# Patient Record
Sex: Female | Born: 1995 | Race: White | Hispanic: No | Marital: Single | Smoking: Current every day smoker
Health system: Southern US, Community
[De-identification: ages and names within clinical notes are randomized; demographics above are authoritative.]

## PROBLEM LIST (undated history)

## (undated) ENCOUNTER — Emergency Department (HOSPITAL_COMMUNITY): Discharge status: Absent without leave | Payer: Medicaid Other

## (undated) DIAGNOSIS — F329 Major depressive disorder, single episode, unspecified: Secondary | ICD-10-CM

## (undated) DIAGNOSIS — F32A Depression, unspecified: Secondary | ICD-10-CM

## (undated) DIAGNOSIS — J45909 Unspecified asthma, uncomplicated: Secondary | ICD-10-CM

## (undated) DIAGNOSIS — Z7289 Other problems related to lifestyle: Secondary | ICD-10-CM

## (undated) DIAGNOSIS — L905 Scar conditions and fibrosis of skin: Secondary | ICD-10-CM

## (undated) HISTORY — PX: LEG SURGERY: SHX1003

---
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
## 2017-05-16 ENCOUNTER — Emergency Department (HOSPITAL_COMMUNITY)
Admission: EM | Admit: 2017-05-16 | Discharge: 2017-05-16 | Disposition: A | Discharge status: Home / Self Care | Payer: Medicaid Other | Attending: Emergency Medicine | Admitting: Emergency Medicine

## 2017-05-16 DIAGNOSIS — J45909 Unspecified asthma, uncomplicated: Secondary | ICD-10-CM | POA: Insufficient documentation

## 2017-05-16 DIAGNOSIS — F17210 Nicotine dependence, cigarettes, uncomplicated: Secondary | ICD-10-CM | POA: Diagnosis not present

## 2017-05-16 DIAGNOSIS — Z79899 Other long term (current) drug therapy: Secondary | ICD-10-CM | POA: Insufficient documentation

## 2017-05-16 DIAGNOSIS — Z87898 Personal history of other specified conditions: Secondary | ICD-10-CM

## 2017-05-16 DIAGNOSIS — J00 Acute nasopharyngitis [common cold]: Secondary | ICD-10-CM | POA: Diagnosis not present

## 2017-05-16 DIAGNOSIS — R509 Fever, unspecified: Secondary | ICD-10-CM | POA: Diagnosis present

## 2017-05-16 LAB — RAPID STREP SCREEN (MED CTR MEBANE ONLY): Streptococcus, Group A Screen (Direct): NEGATIVE

## 2017-05-16 MED ORDER — ALBUTEROL SULFATE (2.5 MG/3ML) 0.083% IN NEBU
2.5000 mg | INHALATION_SOLUTION | Freq: Once | RESPIRATORY_TRACT | Status: AC
  Administered 2017-05-16: 2.5 mg via RESPIRATORY_TRACT
  Filled 2017-05-16: qty 3

## 2017-05-16 MED ORDER — ALBUTEROL SULFATE HFA 108 (90 BASE) MCG/ACT IN AERS: 1.0000 | INHALATION_SPRAY | Freq: Four times a day (QID) | RESPIRATORY_TRACT | 0 refills | Status: AC | PRN

## 2017-05-16 MED ORDER — PREDNISONE 10 MG PO TABS: ORAL_TABLET | ORAL | 0 refills | Status: AC

## 2017-05-16 MED ORDER — IPRATROPIUM-ALBUTEROL 0.5-2.5 (3) MG/3ML IN SOLN
3.0000 mL | Freq: Once | RESPIRATORY_TRACT | Status: AC
  Administered 2017-05-16: 3 mL via RESPIRATORY_TRACT
  Filled 2017-05-16: qty 3

## 2017-05-16 NOTE — ED Triage Notes (Signed)
Back from [place] and noticed sore throat yesterday and coughed up thick secretions.  Rates pain 7/10.

## 2017-05-16 NOTE — ED Notes (Signed)
RT called for breathing treatment.

## 2017-05-16 NOTE — Discharge Instructions (Signed)
I recommend a decongestant such as claritin D or zyrtec D for better nasal decongestant relief.  Use the prednisone as instructed for your wheezing and your albuterol every 4 hours if needed for wheeze or shortness of breath.  Other symptom relief measures include warm salt gargles, vicks vapor inhaler sticks and/or menthol lozenges which can help with congestion and sore throat.
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
## 2017-05-19 LAB — CULTURE, GROUP A STREP (THRC)

## 2017-10-21 ENCOUNTER — Encounter: Payer: Self-pay | Admitting: Emergency Medicine

## 2017-10-21 ENCOUNTER — Other Ambulatory Visit: Payer: Self-pay

## 2017-10-21 ENCOUNTER — Emergency Department
Admission: EM | Admit: 2017-10-21 | Discharge: 2017-10-21 | Disposition: A | Discharge status: Home / Self Care | Payer: Self-pay | Attending: Student in an Organized Health Care Education/Training Program | Admitting: Student in an Organized Health Care Education/Training Program

## 2017-10-21 DIAGNOSIS — Z79899 Other long term (current) drug therapy: Secondary | ICD-10-CM | POA: Insufficient documentation

## 2017-10-21 DIAGNOSIS — R509 Fever, unspecified: Secondary | ICD-10-CM | POA: Insufficient documentation

## 2017-10-21 DIAGNOSIS — J45909 Unspecified asthma, uncomplicated: Secondary | ICD-10-CM | POA: Insufficient documentation

## 2017-10-21 DIAGNOSIS — N12 Tubulo-interstitial nephritis, not specified as acute or chronic: Secondary | ICD-10-CM | POA: Insufficient documentation

## 2017-10-21 DIAGNOSIS — F17210 Nicotine dependence, cigarettes, uncomplicated: Secondary | ICD-10-CM | POA: Insufficient documentation

## 2017-10-21 LAB — COMPREHENSIVE METABOLIC PANEL
ALK PHOS: 63 U/L (ref 38–126)
ALT: 13 U/L — ABNORMAL LOW (ref 14–54)
ANION GAP: 10 (ref 5–15)
AST: 20 U/L (ref 15–41)
Albumin: 4 g/dL (ref 3.5–5.0)
BUN: 8 mg/dL (ref 6–20)
CALCIUM: 9.2 mg/dL (ref 8.9–10.3)
CO2: 19 mmol/L — AB (ref 22–32)
Chloride: 101 mmol/L (ref 101–111)
Creatinine, Ser: 0.51 mg/dL (ref 0.44–1.00)
Glucose, Bld: 128 mg/dL — ABNORMAL HIGH (ref 65–99)
Potassium: 3.5 mmol/L (ref 3.5–5.1)
SODIUM: 130 mmol/L — AB (ref 135–145)
Total Bilirubin: 0.9 mg/dL (ref 0.3–1.2)
Total Protein: 7.8 g/dL (ref 6.5–8.1)

## 2017-10-21 LAB — URINALYSIS, COMPLETE (UACMP) WITH MICROSCOPIC
Bilirubin Urine: NEGATIVE
Glucose, UA: NEGATIVE mg/dL
KETONES UR: NEGATIVE mg/dL
NITRITE: NEGATIVE
PH: 5 (ref 5.0–8.0)
Protein, ur: 100 mg/dL — AB
SPECIFIC GRAVITY, URINE: 1.028 (ref 1.005–1.030)

## 2017-10-21 LAB — POCT PREGNANCY, URINE: Preg Test, Ur: NEGATIVE

## 2017-10-21 LAB — CBC
HCT: 42.2 % (ref 35.0–47.0)
HEMOGLOBIN: 13.9 g/dL (ref 12.0–16.0)
MCH: 27.5 pg (ref 26.0–34.0)
MCHC: 33 g/dL (ref 32.0–36.0)
MCV: 83.5 fL (ref 80.0–100.0)
PLATELETS: 169 10*3/uL (ref 150–440)
RBC: 5.05 MIL/uL (ref 3.80–5.20)
RDW: 14.6 % — ABNORMAL HIGH (ref 11.5–14.5)
WBC: 12.2 10*3/uL — ABNORMAL HIGH (ref 3.6–11.0)

## 2017-10-21 MED ORDER — SODIUM CHLORIDE 0.9 % IV BOLUS (SEPSIS)
500.0000 mL | Freq: Once | INTRAVENOUS | Status: AC
  Administered 2017-10-21: 500 mL via INTRAVENOUS

## 2017-10-21 MED ORDER — KETOROLAC TROMETHAMINE 30 MG/ML IJ SOLN
15.0000 mg | Freq: Once | INTRAMUSCULAR | Status: AC
  Administered 2017-10-21: 15 mg via INTRAVENOUS
  Filled 2017-10-21: qty 1

## 2017-10-21 MED ORDER — MORPHINE SULFATE (PF) 4 MG/ML IV SOLN
INTRAVENOUS | Status: AC
  Filled 2017-10-21: qty 1

## 2017-10-21 MED ORDER — PROMETHAZINE HCL 12.5 MG PO TABS: 12.5000 mg | ORAL_TABLET | Freq: Four times a day (QID) | ORAL | 0 refills | Status: AC | PRN

## 2017-10-21 MED ORDER — CEPHALEXIN 500 MG PO CAPS
500.0000 mg | ORAL_CAPSULE | Freq: Once | ORAL | Status: AC
  Administered 2017-10-21: 500 mg via ORAL
  Filled 2017-10-21: qty 1

## 2017-10-21 MED ORDER — CEPHALEXIN 500 MG PO CAPS: 500.0000 mg | ORAL_CAPSULE | Freq: Three times a day (TID) | ORAL | 0 refills | Status: AC

## 2017-10-21 MED ORDER — ONDANSETRON HCL 4 MG/2ML IJ SOLN
4.0000 mg | Freq: Once | INTRAMUSCULAR | Status: AC | PRN
  Administered 2017-10-21: 4 mg via INTRAVENOUS
  Filled 2017-10-21: qty 2

## 2017-10-21 MED ORDER — MORPHINE SULFATE (PF) 4 MG/ML IV SOLN
4.0000 mg | Freq: Once | INTRAVENOUS | Status: AC
  Administered 2017-10-21: 4 mg via INTRAVENOUS

## 2017-10-21 MED ORDER — ONDANSETRON 4 MG PO TBDP: 4.0000 mg | ORAL_TABLET | Freq: Once | ORAL | Status: DC | PRN

## 2017-10-21 MED ORDER — PROMETHAZINE HCL 25 MG/ML IJ SOLN
12.5000 mg | Freq: Four times a day (QID) | INTRAMUSCULAR | Status: DC | PRN
  Administered 2017-10-21: 12.5 mg via INTRAVENOUS
  Filled 2017-10-21: qty 1
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
## 2017-10-21 NOTE — ED Notes (Signed)
Pt states she has body aches since Monday. Also reports nausea, vomiting, and diarrhea. Family at bedside.

## 2017-10-21 NOTE — ED Triage Notes (Addendum)
Patient to ER for c/o vomiting, joint pain, fever since day before yesterday.

## 2017-10-21 NOTE — ED Notes (Signed)
Patient ambulatory to lobby with NAD noted. Verbalized understanding of discharge instructions, prescriptions, and follow-up care.

## 2018-01-16 ENCOUNTER — Encounter: Payer: Self-pay | Admitting: Emergency Medicine

## 2018-01-16 ENCOUNTER — Emergency Department
Admission: EM | Admit: 2018-01-16 | Discharge: 2018-01-16 | Disposition: A | Discharge status: Home / Self Care | Payer: Self-pay | Attending: Emergency Medicine | Admitting: Emergency Medicine

## 2018-01-16 DIAGNOSIS — Z5321 Procedure and treatment not carried out due to patient leaving prior to being seen by health care provider: Secondary | ICD-10-CM | POA: Insufficient documentation

## 2018-01-16 DIAGNOSIS — R300 Dysuria: Secondary | ICD-10-CM | POA: Insufficient documentation

## 2018-01-16 LAB — URINALYSIS, COMPLETE (UACMP) WITH MICROSCOPIC
Bilirubin Urine: NEGATIVE
GLUCOSE, UA: NEGATIVE mg/dL
Ketones, ur: NEGATIVE mg/dL
Leukocytes, UA: NEGATIVE
NITRITE: POSITIVE — AB
PH: 5 (ref 5.0–8.0)
Protein, ur: 30 mg/dL — AB
SPECIFIC GRAVITY, URINE: 1.021 (ref 1.005–1.030)

## 2018-01-16 NOTE — ED Triage Notes (Signed)
Patient states that she has had foul smelling urine and pain with urination times one month.

## 2018-01-16 NOTE — ED Notes (Signed)
Pt left before seeing provider. Pt counseled that based on her urine test results, she might require antibiotics. Pt determined she could not wait as her boyfriend was being seen, also and was, apparently much sicker.

## 2018-01-17 ENCOUNTER — Other Ambulatory Visit: Payer: Self-pay

## 2018-01-17 ENCOUNTER — Emergency Department
Admission: EM | Admit: 2018-01-17 | Discharge: 2018-01-17 | Disposition: A | Discharge status: Home / Self Care | Payer: Medicaid Other | Attending: Emergency Medicine | Admitting: Emergency Medicine

## 2018-01-17 ENCOUNTER — Encounter: Payer: Self-pay | Admitting: Emergency Medicine

## 2018-01-17 DIAGNOSIS — N3000 Acute cystitis without hematuria: Secondary | ICD-10-CM | POA: Insufficient documentation

## 2018-01-17 DIAGNOSIS — J45909 Unspecified asthma, uncomplicated: Secondary | ICD-10-CM | POA: Insufficient documentation

## 2018-01-17 DIAGNOSIS — Z7689 Persons encountering health services in other specified circumstances: Secondary | ICD-10-CM

## 2018-01-17 DIAGNOSIS — Z202 Contact with and (suspected) exposure to infections with a predominantly sexual mode of transmission: Secondary | ICD-10-CM | POA: Insufficient documentation

## 2018-01-17 DIAGNOSIS — Z79899 Other long term (current) drug therapy: Secondary | ICD-10-CM | POA: Insufficient documentation

## 2018-01-17 DIAGNOSIS — F17210 Nicotine dependence, cigarettes, uncomplicated: Secondary | ICD-10-CM | POA: Insufficient documentation

## 2018-01-17 LAB — WET PREP, GENITAL
CLUE CELLS WET PREP: NONE SEEN
Sperm: NONE SEEN
Trich, Wet Prep: NONE SEEN
Yeast Wet Prep HPF POC: NONE SEEN

## 2018-01-17 LAB — POCT PREGNANCY, URINE: Preg Test, Ur: NEGATIVE

## 2018-01-17 MED ORDER — CEPHALEXIN 500 MG PO CAPS: 500.0000 mg | ORAL_CAPSULE | Freq: Two times a day (BID) | ORAL | 0 refills | Status: AC

## 2018-01-17 MED ORDER — AZITHROMYCIN 500 MG PO TABS
1000.0000 mg | ORAL_TABLET | Freq: Once | ORAL | Status: AC
  Administered 2018-01-17: 1000 mg via ORAL
  Filled 2018-01-17: qty 2

## 2018-01-17 MED ORDER — CEFTRIAXONE SODIUM 250 MG IJ SOLR
250.0000 mg | Freq: Once | INTRAMUSCULAR | Status: AC
  Administered 2018-01-17: 250 mg via INTRAMUSCULAR
  Filled 2018-01-17: qty 250

## 2018-01-17 NOTE — Discharge Instructions (Addendum)
You are being treated for a UTI. You have been treated for gonorrhea and chlamydia. You may call back to confirm results. See your provider for ongoing symptoms.

## 2018-01-17 NOTE — ED Triage Notes (Addendum)
Patient ambulatory to triage with steady gait, without difficulty or distress noted; pt reports her SO here last night and dx with STD; st she is here for treatment; c/o odorous urine and white vag discharge; UA completed last night
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
## 2018-01-18 LAB — CHLAMYDIA/NGC RT PCR (ARMC ONLY)
Chlamydia Tr: NOT DETECTED
N gonorrhoeae: NOT DETECTED

## 2018-08-21 ENCOUNTER — Emergency Department
Admission: EM | Admit: 2018-08-21 | Discharge: 2018-08-21 | Discharge status: Against Medical Advice | Payer: Medicaid Other
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
## 2019-01-02 ENCOUNTER — Encounter: Payer: Self-pay | Admitting: Emergency Medicine

## 2019-01-02 ENCOUNTER — Emergency Department
Admission: EM | Admit: 2019-01-02 | Discharge: 2019-01-02 | Disposition: A | Discharge status: Home / Self Care | Payer: Medicaid Other | Attending: Emergency Medicine | Admitting: Emergency Medicine

## 2019-01-02 ENCOUNTER — Other Ambulatory Visit: Payer: Self-pay

## 2019-01-02 DIAGNOSIS — Z9189 Other specified personal risk factors, not elsewhere classified: Secondary | ICD-10-CM

## 2019-01-02 DIAGNOSIS — R1031 Right lower quadrant pain: Secondary | ICD-10-CM | POA: Insufficient documentation

## 2019-01-02 DIAGNOSIS — Z5321 Procedure and treatment not carried out due to patient leaving prior to being seen by health care provider: Secondary | ICD-10-CM | POA: Insufficient documentation

## 2019-01-02 DIAGNOSIS — M545 Low back pain: Secondary | ICD-10-CM | POA: Insufficient documentation

## 2019-01-02 LAB — CBC
HCT: 41 % (ref 36.0–46.0)
Hemoglobin: 13.5 g/dL (ref 12.0–15.0)
MCH: 28.1 pg (ref 26.0–34.0)
MCHC: 32.9 g/dL (ref 30.0–36.0)
MCV: 85.2 fL (ref 80.0–100.0)
NRBC: 0 % (ref 0.0–0.2)
PLATELETS: 198 10*3/uL (ref 150–400)
RBC: 4.81 MIL/uL (ref 3.87–5.11)
RDW: 13.8 % (ref 11.5–15.5)
WBC: 15.3 10*3/uL — AB (ref 4.0–10.5)

## 2019-01-02 LAB — POCT PREGNANCY, URINE: PREG TEST UR: NEGATIVE

## 2019-01-02 LAB — COMPREHENSIVE METABOLIC PANEL
ALBUMIN: 3.9 g/dL (ref 3.5–5.0)
ALK PHOS: 82 U/L (ref 38–126)
ALT: 12 U/L (ref 0–44)
AST: 15 U/L (ref 15–41)
Anion gap: 8 (ref 5–15)
BILIRUBIN TOTAL: 0.4 mg/dL (ref 0.3–1.2)
BUN: 11 mg/dL (ref 6–20)
CALCIUM: 8.8 mg/dL — AB (ref 8.9–10.3)
CO2: 22 mmol/L (ref 22–32)
Chloride: 108 mmol/L (ref 98–111)
Creatinine, Ser: 0.49 mg/dL (ref 0.44–1.00)
GFR calc Af Amer: >60 mL/min (ref >60)
GLUCOSE: 112 mg/dL — AB (ref 70–99)
Potassium: 3.7 mmol/L (ref 3.5–5.1)
Sodium: 138 mmol/L (ref 135–145)
Total Protein: 6.7 g/dL (ref 6.5–8.1)

## 2019-01-02 LAB — URINALYSIS, COMPLETE (UACMP) WITH MICROSCOPIC
Bacteria, UA: NONE SEEN
SPECIFIC GRAVITY, URINE: 1.026 (ref 1.005–1.030)
WBC, UA: >50 WBC/hpf — ABNORMAL HIGH (ref 0–5)

## 2019-01-02 LAB — LIPASE, BLOOD: Lipase: 31 U/L (ref 11–51)

## 2019-01-02 NOTE — ED Triage Notes (Signed)
Here for pain across whole lower abdomen mostly on RLQ.  Also c/o pain across lower back. Hx of kidney infection per pt.  +hematuria.  No vomiting. No fever. VSS.  No dysuria.

## 2019-01-02 NOTE — ED Notes (Signed)
RN went to check on patient, patient not in room and found gown on stretcher. Patient not in hallway, MD notified that patient left. Per triage nurse, patient walked out front door.

## 2019-01-02 NOTE — ED Provider Notes (Signed)
-----------------------------------------   6:14 PM on 01/02/2019 -----------------------------------------  Pt outside her room in the hallway screaming in front of other patients and using profane language.  I explained her that we will be with her as soon as possible, she became very abusive and angry because she has been waiting for 2 hours and she has not been given any food.  I did explain to her that we are in a pandemic and we are doing her best on limited resources to take care of her and I would be in to see her as soon as I can.  I was with a acute patient.   she became very irate. Encouraged her to wait in her room and that we would see her asap. She threatened to make a film of the fact that she was not being seen and given food immediately, and put it on facebook.  I did explain to her that if she felt like she wanted to leave of course she could at any time she could but otherwise I would be to the room as soon as possible.  When return to the room she had gone.   Jeanmarie Plant, MD 01/02/19 [phone]

## 2019-01-04 LAB — URINE CULTURE

## 2019-03-28 ENCOUNTER — Emergency Department (HOSPITAL_COMMUNITY)
Admission: EM | Admit: 2019-03-28 | Discharge: 2019-03-28 | Disposition: A | Discharge status: Home / Self Care | Payer: Self-pay | Attending: Emergency Medicine | Admitting: Emergency Medicine

## 2019-03-28 ENCOUNTER — Encounter (HOSPITAL_COMMUNITY): Payer: Self-pay

## 2019-03-28 ENCOUNTER — Other Ambulatory Visit: Payer: Self-pay

## 2019-03-28 ENCOUNTER — Emergency Department (HOSPITAL_COMMUNITY): Payer: Self-pay

## 2019-03-28 DIAGNOSIS — F17210 Nicotine dependence, cigarettes, uncomplicated: Secondary | ICD-10-CM | POA: Insufficient documentation

## 2019-03-28 DIAGNOSIS — Z79899 Other long term (current) drug therapy: Secondary | ICD-10-CM | POA: Insufficient documentation

## 2019-03-28 DIAGNOSIS — Z880 Allergy status to penicillin: Secondary | ICD-10-CM | POA: Insufficient documentation

## 2019-03-28 DIAGNOSIS — R1031 Right lower quadrant pain: Secondary | ICD-10-CM

## 2019-03-28 DIAGNOSIS — K529 Noninfective gastroenteritis and colitis, unspecified: Secondary | ICD-10-CM | POA: Insufficient documentation

## 2019-03-28 DIAGNOSIS — J45909 Unspecified asthma, uncomplicated: Secondary | ICD-10-CM | POA: Insufficient documentation

## 2019-03-28 LAB — COMPREHENSIVE METABOLIC PANEL
ALT: 13 U/L (ref 0–44)
AST: 19 U/L (ref 15–41)
Albumin: 4.5 g/dL (ref 3.5–5.0)
Alkaline Phosphatase: 59 U/L (ref 38–126)
Anion gap: 8 (ref 5–15)
BUN: 9 mg/dL (ref 6–20)
CO2: 25 mmol/L (ref 22–32)
Calcium: 9.5 mg/dL (ref 8.9–10.3)
Chloride: 107 mmol/L (ref 98–111)
Creatinine, Ser: 0.76 mg/dL (ref 0.44–1.00)
GFR calc Af Amer: >60 mL/min (ref >60)
GFR calc non Af Amer: >60 mL/min (ref >60)
Glucose, Bld: 97 mg/dL (ref 70–99)
Potassium: 3.6 mmol/L (ref 3.5–5.1)
Sodium: 140 mmol/L (ref 135–145)
Total Bilirubin: 1.4 mg/dL — ABNORMAL HIGH (ref 0.3–1.2)
Total Protein: 7.1 g/dL (ref 6.5–8.1)

## 2019-03-28 LAB — CBC WITH DIFFERENTIAL/PLATELET
Abs Immature Granulocytes: 0.01 10*3/uL (ref 0.00–0.07)
Basophils Absolute: 0 10*3/uL (ref 0.0–0.1)
Basophils Relative: 0 %
Eosinophils Absolute: 0 10*3/uL (ref 0.0–0.5)
Eosinophils Relative: 1 %
HCT: 42.7 % (ref 36.0–46.0)
Hemoglobin: 13.6 g/dL (ref 12.0–15.0)
Immature Granulocytes: 0 %
Lymphocytes Relative: 26 %
Lymphs Abs: 1.7 10*3/uL (ref 0.7–4.0)
MCH: 28.4 pg (ref 26.0–34.0)
MCHC: 31.9 g/dL (ref 30.0–36.0)
MCV: 89.1 fL (ref 80.0–100.0)
Monocytes Absolute: 0.5 10*3/uL (ref 0.1–1.0)
Monocytes Relative: 8 %
Neutro Abs: 4.1 10*3/uL (ref 1.7–7.7)
Neutrophils Relative %: 65 %
Platelets: 233 10*3/uL (ref 150–400)
RBC: 4.79 MIL/uL (ref 3.87–5.11)
RDW: 13.8 % (ref 11.5–15.5)
WBC: 6.4 10*3/uL (ref 4.0–10.5)
nRBC: 0 % (ref 0.0–0.2)

## 2019-03-28 LAB — RAPID URINE DRUG SCREEN, HOSP PERFORMED
Amphetamines: POSITIVE — AB
Barbiturates: NOT DETECTED
Benzodiazepines: NOT DETECTED
Cocaine: NOT DETECTED
Opiates: NOT DETECTED
Tetrahydrocannabinol: NOT DETECTED

## 2019-03-28 LAB — I-STAT BETA HCG BLOOD, ED (MC, WL, AP ONLY): I-stat hCG, quantitative: <5 m[IU]/mL (ref <5)

## 2019-03-28 LAB — URINALYSIS, ROUTINE W REFLEX MICROSCOPIC
Bacteria, UA: NONE SEEN
Bilirubin Urine: NEGATIVE
Glucose, UA: NEGATIVE mg/dL
Hgb urine dipstick: NEGATIVE
Ketones, ur: 20 mg/dL — AB
Leukocytes,Ua: NEGATIVE
Nitrite: NEGATIVE
Protein, ur: 30 mg/dL — AB
Specific Gravity, Urine: 1.024 (ref 1.005–1.030)
pH: 6 (ref 5.0–8.0)

## 2019-03-28 LAB — LACTIC ACID, PLASMA: Lactic Acid, Venous: 0.7 mmol/L (ref 0.5–1.9)

## 2019-03-28 LAB — LIPASE, BLOOD: Lipase: 19 U/L (ref 11–51)

## 2019-03-28 MED ORDER — DICYCLOMINE HCL 20 MG PO TABS: 20.0000 mg | ORAL_TABLET | Freq: Two times a day (BID) | ORAL | 0 refills | Status: AC | PRN

## 2019-03-28 MED ORDER — KETOROLAC TROMETHAMINE 15 MG/ML IJ SOLN
15.0000 mg | Freq: Once | INTRAMUSCULAR | Status: AC
  Administered 2019-03-28: 15 mg via INTRAVENOUS
  Filled 2019-03-28: qty 1

## 2019-03-28 MED ORDER — ONDANSETRON HCL 4 MG/2ML IJ SOLN
4.0000 mg | Freq: Once | INTRAMUSCULAR | Status: AC
  Administered 2019-03-28: 4 mg via INTRAVENOUS
  Filled 2019-03-28: qty 2

## 2019-03-28 MED ORDER — IOPAMIDOL (ISOVUE-300) INJECTION 61%
100.0000 mL | Freq: Once | INTRAVENOUS | Status: AC | PRN
  Administered 2019-03-28: 100 mL via INTRAVENOUS

## 2019-03-28 MED ORDER — DICYCLOMINE HCL 10 MG PO CAPS
10.0000 mg | ORAL_CAPSULE | Freq: Once | ORAL | Status: AC
  Administered 2019-03-28: 10 mg via ORAL
  Filled 2019-03-28: qty 1

## 2019-03-28 MED ORDER — FENTANYL CITRATE (PF) 100 MCG/2ML IJ SOLN
25.0000 ug | Freq: Once | INTRAMUSCULAR | Status: AC
  Administered 2019-03-28: 08:00:00 25 ug via INTRAVENOUS
  Filled 2019-03-28: qty 2

## 2019-03-28 MED ORDER — ONDANSETRON HCL 4 MG PO TABS: 4.0000 mg | ORAL_TABLET | Freq: Three times a day (TID) | ORAL | 0 refills | Status: DC | PRN

## 2019-03-28 MED ORDER — SODIUM CHLORIDE 0.9 % IV BOLUS
1000.0000 mL | Freq: Once | INTRAVENOUS | Status: AC
  Administered 2019-03-28: 1000 mL via INTRAVENOUS
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
## 2019-03-28 NOTE — ED Notes (Signed)
Patient verbalizes understanding of discharge instructions. Opportunity for questioning and answers were provided. Armband removed by staff, pt discharged from ED.  

## 2019-03-28 NOTE — ED Notes (Signed)
HusbandNatale Milch - [phone]

## 2019-03-28 NOTE — ED Triage Notes (Addendum)
Pt reports RLQ pain that radiates to the side with nausea. Pt unable to eat. No vomiting. Pt also reports swelling and warmth in the area. Pt also reports constipation, last BM over the weekend, taking laxatives without relief.

## 2019-03-28 NOTE — Discharge Instructions (Addendum)
Take bentyl as needed for pain or spasm. Use zofran as needed for nausea and vomiting.  Use tylenol as needed for further pain control.  Use heating pads for further management/evaluation if pain persists.  Return to the ER if you develop fevers, persistent vomiting, severe/worsening pain, or any new, worsening, or concerning symptoms.

## 2019-03-28 NOTE — ED Notes (Signed)
Manual BP 122/78

## 2019-10-14 ENCOUNTER — Emergency Department: Payer: Self-pay

## 2019-10-14 ENCOUNTER — Other Ambulatory Visit: Payer: Self-pay

## 2019-10-14 ENCOUNTER — Emergency Department
Admission: EM | Admit: 2019-10-14 | Discharge: 2019-10-14 | Disposition: A | Discharge status: Home / Self Care | Payer: Self-pay | Attending: Emergency Medicine | Admitting: Emergency Medicine

## 2019-10-14 DIAGNOSIS — Y999 Unspecified external cause status: Secondary | ICD-10-CM | POA: Insufficient documentation

## 2019-10-14 DIAGNOSIS — S61224A Laceration with foreign body of right ring finger without damage to nail, initial encounter: Secondary | ICD-10-CM

## 2019-10-14 DIAGNOSIS — F17210 Nicotine dependence, cigarettes, uncomplicated: Secondary | ICD-10-CM | POA: Insufficient documentation

## 2019-10-14 DIAGNOSIS — W25XXXA Contact with sharp glass, initial encounter: Secondary | ICD-10-CM | POA: Insufficient documentation

## 2019-10-14 DIAGNOSIS — Z23 Encounter for immunization: Secondary | ICD-10-CM | POA: Insufficient documentation

## 2019-10-14 DIAGNOSIS — Y9389 Activity, other specified: Secondary | ICD-10-CM | POA: Insufficient documentation

## 2019-10-14 DIAGNOSIS — S61229A Laceration with foreign body of unspecified finger without damage to nail, initial encounter: Secondary | ICD-10-CM | POA: Insufficient documentation

## 2019-10-14 DIAGNOSIS — Y929 Unspecified place or not applicable: Secondary | ICD-10-CM | POA: Insufficient documentation

## 2019-10-14 DIAGNOSIS — J45909 Unspecified asthma, uncomplicated: Secondary | ICD-10-CM | POA: Insufficient documentation

## 2019-10-14 MED ORDER — LIDOCAINE HCL (PF) 1 % IJ SOLN
5.0000 mL | Freq: Once | INTRAMUSCULAR | Status: AC
  Administered 2019-10-14: 04:00:00 5 mL

## 2019-10-14 MED ORDER — OXYCODONE-ACETAMINOPHEN 5-325 MG PO TABS
2.0000 | ORAL_TABLET | Freq: Once | ORAL | Status: AC
  Administered 2019-10-14: 04:00:00 2 via ORAL
  Filled 2019-10-14: qty 2

## 2019-10-14 MED ORDER — TETANUS-DIPHTH-ACELL PERTUSSIS 5-2.5-18.5 LF-MCG/0.5 IM SUSP
INTRAMUSCULAR | Status: AC
  Filled 2019-10-14: qty 0.5

## 2019-10-14 MED ORDER — TETANUS-DIPHTH-ACELL PERTUSSIS 5-2.5-18.5 LF-MCG/0.5 IM SUSP
0.5000 mL | Freq: Once | INTRAMUSCULAR | Status: AC
  Administered 2019-10-14: 04:00:00 0.5 mL via INTRAMUSCULAR

## 2019-10-14 MED ORDER — CEPHALEXIN 500 MG PO CAPS: 500.0000 mg | ORAL_CAPSULE | Freq: Four times a day (QID) | ORAL | 0 refills | Status: AC

## 2019-10-14 MED ORDER — LIDOCAINE HCL (PF) 1 % IJ SOLN
INTRAMUSCULAR | Status: AC
  Filled 2019-10-14: qty 5

## 2019-10-14 MED ORDER — HYDROCODONE-ACETAMINOPHEN 5-325 MG PO TABS: 2.0000 | ORAL_TABLET | Freq: Four times a day (QID) | ORAL | 0 refills | Status: DC | PRN

## 2019-10-14 MED ORDER — CEPHALEXIN 500 MG PO CAPS
500.0000 mg | ORAL_CAPSULE | Freq: Once | ORAL | Status: AC
  Administered 2019-10-14: 05:00:00 500 mg via ORAL
  Filled 2019-10-14: qty 1

## 2019-10-14 NOTE — ED Notes (Signed)
Sterile dressing applied prior to application of finger splint.

## 2019-10-14 NOTE — Discharge Instructions (Addendum)
As we discussed, I was able to remove 2 pieces of glass from your finger laceration, but it appears that you still have at least 2 more pieces that are deep within your finger and close to the bone.  It would be dangerous for me to try to cut into the finger or drive the instruments deep into the finger here in the emergency department; it may cause permanent loss of sensation or damage to the blood supply.  Although preferably we could safely remove the pieces of glass, it is better to treat you with antibiotics and protect the finger and have you follow-up with a hand specialist for their recommendations about whether or not you need additional treatment.  Sometimes foreign bodies are left in place if it is thought that it will cause more damage to remove them than to leave them in.  Please take the prescribed antibiotics.  Use the finger splint provided for comfort.  I encourage you to wash your finger with soap and warm water and to keep it clean and dry.  Use over-the-counter Tylenol and ibuprofen according to label instructions for pain relief.  Call the clinic number provided to schedule the next available follow up appointment.
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
## 2019-10-14 NOTE — ED Triage Notes (Signed)
Patient reports she dropped a glass, attempted to catch it in right and, and glass broke in her hand. Laceration noted to distal 4th digit. Patient reports decreased sensation to entire hand. Patient able to flex/extend hand.
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
## 2019-10-14 NOTE — ED Notes (Signed)
MD in to repair laceration.

## 2020-02-20 ENCOUNTER — Emergency Department
Admission: EM | Admit: 2020-02-20 | Discharge: 2020-02-20 | Discharge status: Absent without leave | Payer: Medicaid Other

## 2020-07-29 ENCOUNTER — Emergency Department
Admission: EM | Admit: 2020-07-29 | Discharge: 2020-07-29 | Disposition: A | Discharge status: Home / Self Care | Payer: Medicaid Other | Attending: Emergency Medicine | Admitting: Emergency Medicine

## 2020-07-29 ENCOUNTER — Encounter: Payer: Self-pay | Admitting: *Deleted

## 2020-07-29 ENCOUNTER — Other Ambulatory Visit: Payer: Self-pay

## 2020-07-29 DIAGNOSIS — O26899 Other specified pregnancy related conditions, unspecified trimester: Secondary | ICD-10-CM | POA: Insufficient documentation

## 2020-07-29 DIAGNOSIS — Z3A00 Weeks of gestation of pregnancy not specified: Secondary | ICD-10-CM | POA: Insufficient documentation

## 2020-07-29 DIAGNOSIS — R102 Pelvic and perineal pain: Secondary | ICD-10-CM | POA: Insufficient documentation

## 2020-07-29 DIAGNOSIS — Z5321 Procedure and treatment not carried out due to patient leaving prior to being seen by health care provider: Secondary | ICD-10-CM | POA: Insufficient documentation

## 2020-07-29 DIAGNOSIS — O200 Threatened abortion: Secondary | ICD-10-CM | POA: Insufficient documentation

## 2020-07-29 LAB — COMPREHENSIVE METABOLIC PANEL
ALT: 61 U/L — ABNORMAL HIGH (ref 0–44)
AST: 32 U/L (ref 15–41)
Albumin: 4.4 g/dL (ref 3.5–5.0)
Alkaline Phosphatase: 67 U/L (ref 38–126)
Anion gap: 12 (ref 5–15)
BUN: 9 mg/dL (ref 6–20)
CO2: 24 mmol/L (ref 22–32)
Calcium: 9.5 mg/dL (ref 8.9–10.3)
Chloride: 102 mmol/L (ref 98–111)
Creatinine, Ser: 0.56 mg/dL (ref 0.44–1.00)
GFR, Estimated: >60 mL/min (ref >60)
Glucose, Bld: 92 mg/dL (ref 70–99)
Potassium: 3.7 mmol/L (ref 3.5–5.1)
Sodium: 138 mmol/L (ref 135–145)
Total Bilirubin: 1.2 mg/dL (ref 0.3–1.2)
Total Protein: 8.1 g/dL (ref 6.5–8.1)

## 2020-07-29 LAB — CBC WITH DIFFERENTIAL/PLATELET
Abs Immature Granulocytes: 0.02 10*3/uL (ref 0.00–0.07)
Basophils Absolute: 0 10*3/uL (ref 0.0–0.1)
Basophils Relative: 0 %
Eosinophils Absolute: 0.1 10*3/uL (ref 0.0–0.5)
Eosinophils Relative: 1 %
HCT: 40.1 % (ref 36.0–46.0)
Hemoglobin: 13.1 g/dL (ref 12.0–15.0)
Immature Granulocytes: 0 %
Lymphocytes Relative: 27 %
Lymphs Abs: 1.6 10*3/uL (ref 0.7–4.0)
MCH: 27.5 pg (ref 26.0–34.0)
MCHC: 32.7 g/dL (ref 30.0–36.0)
MCV: 84.2 fL (ref 80.0–100.0)
Monocytes Absolute: 0.4 10*3/uL (ref 0.1–1.0)
Monocytes Relative: 6 %
Neutro Abs: 3.9 10*3/uL (ref 1.7–7.7)
Neutrophils Relative %: 66 %
Platelets: 245 10*3/uL (ref 150–400)
RBC: 4.76 MIL/uL (ref 3.87–5.11)
RDW: 14.3 % (ref 11.5–15.5)
WBC: 6 10*3/uL (ref 4.0–10.5)
nRBC: 0 % (ref 0.0–0.2)

## 2020-07-29 LAB — HCG, QUANTITATIVE, PREGNANCY: hCG, Beta Chain, Quant, S: <1 m[IU]/mL (ref <5)

## 2020-07-29 MED ORDER — ONDANSETRON 4 MG PO TBDP
4.0000 mg | ORAL_TABLET | Freq: Once | ORAL | Status: DC
  Filled 2020-07-29: qty 1

## 2020-07-29 NOTE — ED Triage Notes (Signed)
Pt reporting she has been a month late on her period and had been thinking she may be pregnant. Pt denies having taken a pregnancy test though. Today pt started having pelvic cramping and vaginal bleeding. While using the bathroom pt reports feeling as though something "fell out."

## 2020-07-29 NOTE — ED Notes (Signed)
Pt unable to urinate at this time but given a urine cup and verbalized understanding to collect a sample when able.   During triage pt reports feeling nauseous and is requesting nausea medications.

## 2020-08-15 LAB — ABO/RH: ABO/RH(D): O POS

## 2021-02-07 ENCOUNTER — Other Ambulatory Visit: Payer: Self-pay

## 2021-02-07 ENCOUNTER — Emergency Department
Admission: EM | Admit: 2021-02-07 | Discharge: 2021-02-07 | Disposition: A | Discharge status: Home / Self Care | Payer: Medicaid Other | Attending: Emergency Medicine | Admitting: Emergency Medicine

## 2021-02-07 DIAGNOSIS — Z202 Contact with and (suspected) exposure to infections with a predominantly sexual mode of transmission: Secondary | ICD-10-CM | POA: Insufficient documentation

## 2021-02-07 DIAGNOSIS — F17210 Nicotine dependence, cigarettes, uncomplicated: Secondary | ICD-10-CM | POA: Insufficient documentation

## 2021-02-07 DIAGNOSIS — N764 Abscess of vulva: Secondary | ICD-10-CM | POA: Insufficient documentation

## 2021-02-07 DIAGNOSIS — L0291 Cutaneous abscess, unspecified: Secondary | ICD-10-CM

## 2021-02-07 DIAGNOSIS — J45909 Unspecified asthma, uncomplicated: Secondary | ICD-10-CM | POA: Insufficient documentation

## 2021-02-07 MED ORDER — FENTANYL CITRATE (PF) 100 MCG/2ML IJ SOLN
50.0000 ug | Freq: Once | INTRAMUSCULAR | Status: AC
  Administered 2021-02-07: 50 ug via INTRAVENOUS
  Filled 2021-02-07: qty 2

## 2021-02-07 MED ORDER — SULFAMETHOXAZOLE-TRIMETHOPRIM 800-160 MG PO TABS: 1.0000 | ORAL_TABLET | Freq: Two times a day (BID) | ORAL | 0 refills | Status: DC

## 2021-02-07 MED ORDER — DOXYCYCLINE HYCLATE 100 MG PO TABS
100.0000 mg | ORAL_TABLET | Freq: Once | ORAL | Status: AC
  Administered 2021-02-07: 100 mg via ORAL
  Filled 2021-02-07: qty 1

## 2021-02-07 MED ORDER — SODIUM CHLORIDE 0.9 % IV SOLN
1.0000 g | Freq: Once | INTRAVENOUS | Status: AC
  Administered 2021-02-07: 1 g via INTRAVENOUS
  Filled 2021-02-07: qty 10

## 2021-02-07 MED ORDER — OXYCODONE-ACETAMINOPHEN 5-325 MG PO TABS
1.0000 | ORAL_TABLET | Freq: Once | ORAL | Status: AC
  Administered 2021-02-07: 1 via ORAL
  Filled 2021-02-07: qty 1

## 2021-02-07 MED ORDER — ONDANSETRON 4 MG PO TBDP: 4.0000 mg | ORAL_TABLET | Freq: Three times a day (TID) | ORAL | 0 refills | Status: AC | PRN

## 2021-02-07 MED ORDER — KETAMINE HCL 10 MG/ML IJ SOLN
1.0000 mg/kg | Freq: Once | INTRAMUSCULAR | Status: AC
  Administered 2021-02-07: 64 mg via INTRAVENOUS
  Filled 2021-02-07: qty 1

## 2021-02-07 MED ORDER — ONDANSETRON HCL 4 MG/2ML IJ SOLN: 4.0000 mg | Freq: Once | INTRAMUSCULAR | Status: AC

## 2021-02-07 MED ORDER — LIDOCAINE HCL (PF) 1 % IJ SOLN
5.0000 mL | Freq: Once | INTRAMUSCULAR | Status: AC
  Administered 2021-02-07: 5 mL
  Filled 2021-02-07: qty 5

## 2021-02-07 MED ORDER — ONDANSETRON HCL 4 MG/2ML IJ SOLN
INTRAMUSCULAR | Status: AC
  Administered 2021-02-07: 4 mg via INTRAVENOUS
  Filled 2021-02-07: qty 2

## 2021-02-07 MED ORDER — DOXYCYCLINE HYCLATE 100 MG PO CAPS: 100.0000 mg | ORAL_CAPSULE | Freq: Two times a day (BID) | ORAL | 0 refills | Status: AC

## 2021-02-07 MED ORDER — KETOROLAC TROMETHAMINE 30 MG/ML IJ SOLN
15.0000 mg | Freq: Once | INTRAMUSCULAR | Status: AC
  Administered 2021-02-07: 15 mg via INTRAVENOUS
  Filled 2021-02-07: qty 1

## 2021-02-07 MED ORDER — OXYCODONE-ACETAMINOPHEN 5-325 MG PO TABS: 1.0000 | ORAL_TABLET | ORAL | 0 refills | Status: AC | PRN

## 2021-02-07 NOTE — Discharge Instructions (Signed)
You have been seen in the Emergency Department (ED) today for an abscess. A skin abscess is a bacterial infection that forms a pocket of pus. A boil is a kind of skin abscess. This was drained in the ED. If you were prescribed antibiotics, please make sure to take it fully as prescribed.  Follow-up with your doctor in 24-48 hours for a re-check. If you do not have a doctor you may return to the ER for a re-check.  How can you care for yourself at home?  Apply warm and dry compresses, a heating pad set on low, or a hot water bottle 3 or 4 times a day for pain. Keep a cloth between the heat source and your skin.  If your doctor prescribed antibiotics, take them as directed. Do not stop taking them just because you feel better. You need to take the full course of antibiotics.  Take pain medicines exactly as directed.  If the doctor gave you a prescription medicine for pain, take it as prescribed.  If you are not taking a prescription pain medicine, ask your doctor if you can take an over-the-counter medicine. Keep your bandage clean and dry. Change the bandage whenever it gets wet or dirty, or at least one time a day.  If the abscess was packed with gauze:  Keep follow-up appointments to have the gauze changed or removed. If the doctor instructed you to remove the gauze, gently pull out all of the gauze when your doctor tells you to.  After the gauze is removed, soak the area in warm water for 15 to 20 minutes 2 times a day, until the wound closes.  When should you call for help?  Call your doctor now or seek immediate medical care if:  You have signs of worsening infection, such as:  Increased pain, swelling, warmth, or redness.  Red streaks leading from the infected skin.  Pus draining from the wound.  A fever. Uncontrolled nausea and vomiting Watch closely for changes in your health, and be sure to contact your doctor if:  You do not get better as expected.   When should you call for help?   Call your doctor now or seek immediate medical care if:  You have signs of worsening infection, such as:  Increased pain, swelling, warmth, or redness.  Red streaks leading from the infected skin.  Pus draining from the wound.  A fever. Uncontrolled nausea and vomiting Watch closely for changes in your health, and be sure to contact your doctor if:  You do not get better as expected  
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
## 2021-02-07 NOTE — ED Triage Notes (Signed)
Pt in with co abscess to vaginal area x 1 week. States it has drained but here for persistent pain and swelling.

## 2021-02-28 ENCOUNTER — Emergency Department: Payer: No Typology Code available for payment source

## 2021-02-28 ENCOUNTER — Other Ambulatory Visit: Payer: Self-pay

## 2021-02-28 ENCOUNTER — Encounter: Payer: Self-pay | Admitting: Radiology

## 2021-02-28 ENCOUNTER — Emergency Department
Admission: EM | Admit: 2021-02-28 | Discharge: 2021-02-28 | Disposition: A | Discharge status: Home / Self Care | Payer: No Typology Code available for payment source | Attending: Emergency Medicine | Admitting: Emergency Medicine

## 2021-02-28 DIAGNOSIS — S161XXA Strain of muscle, fascia and tendon at neck level, initial encounter: Secondary | ICD-10-CM | POA: Diagnosis not present

## 2021-02-28 DIAGNOSIS — S5012XA Contusion of left forearm, initial encounter: Secondary | ICD-10-CM | POA: Insufficient documentation

## 2021-02-28 DIAGNOSIS — T1490XA Injury, unspecified, initial encounter: Secondary | ICD-10-CM

## 2021-02-28 DIAGNOSIS — F17210 Nicotine dependence, cigarettes, uncomplicated: Secondary | ICD-10-CM | POA: Insufficient documentation

## 2021-02-28 DIAGNOSIS — S8001XA Contusion of right knee, initial encounter: Secondary | ICD-10-CM | POA: Insufficient documentation

## 2021-02-28 DIAGNOSIS — Y92410 Unspecified street and highway as the place of occurrence of the external cause: Secondary | ICD-10-CM | POA: Diagnosis not present

## 2021-02-28 DIAGNOSIS — S022XXA Fracture of nasal bones, initial encounter for closed fracture: Secondary | ICD-10-CM | POA: Diagnosis not present

## 2021-02-28 DIAGNOSIS — S0181XA Laceration without foreign body of other part of head, initial encounter: Secondary | ICD-10-CM | POA: Insufficient documentation

## 2021-02-28 DIAGNOSIS — S20212A Contusion of left front wall of thorax, initial encounter: Secondary | ICD-10-CM | POA: Insufficient documentation

## 2021-02-28 DIAGNOSIS — S022XXB Fracture of nasal bones, initial encounter for open fracture: Secondary | ICD-10-CM

## 2021-02-28 DIAGNOSIS — S0993XA Unspecified injury of face, initial encounter: Secondary | ICD-10-CM | POA: Diagnosis present

## 2021-02-28 DIAGNOSIS — J45909 Unspecified asthma, uncomplicated: Secondary | ICD-10-CM | POA: Diagnosis not present

## 2021-02-28 LAB — URINALYSIS, COMPLETE (UACMP) WITH MICROSCOPIC
Glucose, UA: NEGATIVE mg/dL
Hgb urine dipstick: NEGATIVE
Ketones, ur: 5 mg/dL — AB
Nitrite: POSITIVE — AB
Protein, ur: 100 mg/dL — AB
Specific Gravity, Urine: 1.032 — ABNORMAL HIGH (ref 1.005–1.030)
pH: 5 (ref 5.0–8.0)

## 2021-02-28 LAB — COMPREHENSIVE METABOLIC PANEL
ALT: 23 U/L (ref 0–44)
AST: 29 U/L (ref 15–41)
Albumin: 4.4 g/dL (ref 3.5–5.0)
Alkaline Phosphatase: 67 U/L (ref 38–126)
Anion gap: 8 (ref 5–15)
BUN: 18 mg/dL (ref 6–20)
CO2: 26 mmol/L (ref 22–32)
Calcium: 9.5 mg/dL (ref 8.9–10.3)
Chloride: 103 mmol/L (ref 98–111)
Creatinine, Ser: 0.78 mg/dL (ref 0.44–1.00)
GFR, Estimated: >60 mL/min (ref >60)
Glucose, Bld: 89 mg/dL (ref 70–99)
Potassium: 3.8 mmol/L (ref 3.5–5.1)
Sodium: 137 mmol/L (ref 135–145)
Total Bilirubin: 1 mg/dL (ref 0.3–1.2)
Total Protein: 7.9 g/dL (ref 6.5–8.1)

## 2021-02-28 LAB — CBC WITH DIFFERENTIAL/PLATELET
Abs Immature Granulocytes: 0.03 10*3/uL (ref 0.00–0.07)
Basophils Absolute: 0 10*3/uL (ref 0.0–0.1)
Basophils Relative: 0 %
Eosinophils Absolute: 0.1 10*3/uL (ref 0.0–0.5)
Eosinophils Relative: 1 %
HCT: 40.9 % (ref 36.0–46.0)
Hemoglobin: 13.6 g/dL (ref 12.0–15.0)
Immature Granulocytes: 0 %
Lymphocytes Relative: 28 %
Lymphs Abs: 2.1 10*3/uL (ref 0.7–4.0)
MCH: 27.5 pg (ref 26.0–34.0)
MCHC: 33.3 g/dL (ref 30.0–36.0)
MCV: 82.8 fL (ref 80.0–100.0)
Monocytes Absolute: 0.6 10*3/uL (ref 0.1–1.0)
Monocytes Relative: 7 %
Neutro Abs: 4.9 10*3/uL (ref 1.7–7.7)
Neutrophils Relative %: 64 %
Platelets: 209 10*3/uL (ref 150–400)
RBC: 4.94 MIL/uL (ref 3.87–5.11)
RDW: 14.4 % (ref 11.5–15.5)
WBC: 7.7 10*3/uL (ref 4.0–10.5)
nRBC: 0 % (ref 0.0–0.2)

## 2021-02-28 LAB — POC URINE PREG, ED: Preg Test, Ur: NEGATIVE

## 2021-02-28 MED ORDER — OXYCODONE HCL 5 MG PO TABS: 5.0000 mg | ORAL_TABLET | Freq: Four times a day (QID) | ORAL | 0 refills | Status: AC | PRN

## 2021-02-28 MED ORDER — MORPHINE SULFATE (PF) 4 MG/ML IV SOLN
4.0000 mg | Freq: Once | INTRAVENOUS | Status: AC
  Administered 2021-02-28: 4 mg via INTRAVENOUS
  Filled 2021-02-28: qty 1

## 2021-02-28 MED ORDER — LIDOCAINE-EPINEPHRINE-TETRACAINE (LET) TOPICAL GEL
3.0000 mL | Freq: Once | TOPICAL | Status: AC
  Administered 2021-02-28: 3 mL via TOPICAL
  Filled 2021-02-28: qty 3

## 2021-02-28 MED ORDER — OXYCODONE HCL 5 MG PO TABS
5.0000 mg | ORAL_TABLET | Freq: Once | ORAL | Status: AC
  Administered 2021-02-28: 5 mg via ORAL
  Filled 2021-02-28: qty 1

## 2021-02-28 MED ORDER — DOXYCYCLINE MONOHYDRATE 100 MG PO TABS: 100.0000 mg | ORAL_TABLET | Freq: Two times a day (BID) | ORAL | 0 refills | Status: AC

## 2021-02-28 MED ORDER — ORPHENADRINE CITRATE 30 MG/ML IJ SOLN
60.0000 mg | Freq: Once | INTRAMUSCULAR | Status: AC
  Administered 2021-02-28: 60 mg via INTRAVENOUS
  Filled 2021-02-28: qty 2

## 2021-02-28 MED ORDER — ONDANSETRON HCL 4 MG/2ML IJ SOLN
4.0000 mg | Freq: Once | INTRAMUSCULAR | Status: AC
  Administered 2021-02-28: 4 mg via INTRAVENOUS
  Filled 2021-02-28: qty 2

## 2021-02-28 MED ORDER — IOHEXOL 300 MG/ML  SOLN
100.0000 mL | Freq: Once | INTRAMUSCULAR | Status: AC | PRN
  Administered 2021-02-28: 100 mL via INTRAVENOUS
  Filled 2021-02-28: qty 100

## 2021-02-28 MED ORDER — DOXYCYCLINE HYCLATE 100 MG PO TABS
100.0000 mg | ORAL_TABLET | Freq: Once | ORAL | Status: AC
  Administered 2021-02-28: 100 mg via ORAL
  Filled 2021-02-28: qty 1

## 2021-02-28 MED ORDER — CYCLOBENZAPRINE HCL 5 MG PO TABS: 5.0000 mg | ORAL_TABLET | Freq: Three times a day (TID) | ORAL | 0 refills | Status: AC | PRN

## 2021-02-28 NOTE — ED Notes (Signed)
Requested urine specimen, patient reports she cannot urinate at this time.
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
## 2021-02-28 NOTE — Discharge Instructions (Addendum)
Please take pain medications as prescribed.  You may take Tylenol for additional pain relief.  Use muscle relaxers as needed for muscle stiffness.  Apply ice to the face, ribs 20 minutes every hour for the next 2 to 3 days.  Call ENT to schedule follow-up Monday morning.  Return to the ER for any worsening symptoms or urgent changes in your health such as headaches, difficulty breathing, swallowing, worsening facial swelling, worsening headaches or any urgent changes in your health
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
## 2021-02-28 NOTE — ED Notes (Signed)
Patient transported to CT 
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
